# Patient Record
Sex: Female | Born: 1945 | Hispanic: Yes | Marital: Married | Smoking: Never smoker
Health system: Southern US, Community
[De-identification: ages and names within clinical notes are randomized; demographics above are authoritative.]

## PROBLEM LIST (undated history)

## (undated) DIAGNOSIS — M542 Cervicalgia: Secondary | ICD-10-CM

## (undated) DIAGNOSIS — D649 Anemia, unspecified: Secondary | ICD-10-CM

## (undated) DIAGNOSIS — I10 Essential (primary) hypertension: Secondary | ICD-10-CM

## (undated) DIAGNOSIS — G8929 Other chronic pain: Secondary | ICD-10-CM

---

## 1999-02-04 DIAGNOSIS — D649 Anemia, unspecified: Secondary | ICD-10-CM

## 1999-02-04 HISTORY — DX: Anemia, unspecified: D64.9

## 2015-03-06 ENCOUNTER — Observation Stay (HOSPITAL_COMMUNITY)
Admission: EM | Admit: 2015-03-06 | Discharge: 2015-03-07 | Disposition: A | Discharge status: Home / Self Care | Payer: Self-pay | Attending: Internal Medicine | Admitting: Internal Medicine

## 2015-03-06 ENCOUNTER — Emergency Department (HOSPITAL_COMMUNITY): Payer: Self-pay

## 2015-03-06 ENCOUNTER — Encounter (HOSPITAL_COMMUNITY): Payer: Self-pay | Admitting: Emergency Medicine

## 2015-03-06 ENCOUNTER — Observation Stay (HOSPITAL_COMMUNITY): Payer: MEDICAID

## 2015-03-06 ENCOUNTER — Observation Stay (HOSPITAL_COMMUNITY): Payer: Self-pay

## 2015-03-06 DIAGNOSIS — R079 Chest pain, unspecified: Principal | ICD-10-CM | POA: Diagnosis present

## 2015-03-06 DIAGNOSIS — E875 Hyperkalemia: Secondary | ICD-10-CM | POA: Insufficient documentation

## 2015-03-06 DIAGNOSIS — R0789 Other chest pain: Secondary | ICD-10-CM

## 2015-03-06 DIAGNOSIS — E1165 Type 2 diabetes mellitus with hyperglycemia: Secondary | ICD-10-CM | POA: Insufficient documentation

## 2015-03-06 DIAGNOSIS — I10 Essential (primary) hypertension: Secondary | ICD-10-CM | POA: Diagnosis present

## 2015-03-06 HISTORY — DX: Anemia, unspecified: D64.9

## 2015-03-06 HISTORY — DX: Cervicalgia: M54.2

## 2015-03-06 HISTORY — DX: Other chronic pain: G89.29

## 2015-03-06 HISTORY — DX: Essential (primary) hypertension: I10

## 2015-03-06 LAB — CBC
HCT: 38.7 % (ref 36.0–46.0)
HCT: 38.6 % (ref 36.0–46.0)
HEMOGLOBIN: 12.9 g/dL (ref 12.0–15.0)
Hemoglobin: 13 g/dL (ref 12.0–15.0)
MCH: 29.5 pg (ref 26.0–34.0)
MCH: 29.5 pg (ref 26.0–34.0)
MCHC: 33.3 g/dL (ref 30.0–36.0)
MCHC: 33.7 g/dL (ref 30.0–36.0)
MCV: 88.4 fL (ref 78.0–100.0)
MCV: 87.7 fL (ref 78.0–100.0)
PLATELETS: 216 10*3/uL (ref 150–400)
Platelets: 215 10*3/uL (ref 150–400)
RBC: 4.38 MIL/uL (ref 3.87–5.11)
RBC: 4.4 MIL/uL (ref 3.87–5.11)
RDW: 13.8 % (ref 11.5–15.5)
RDW: 13.7 % (ref 11.5–15.5)
WBC: 5.6 10*3/uL (ref 4.0–10.5)
WBC: 6.3 10*3/uL (ref 4.0–10.5)

## 2015-03-06 LAB — BASIC METABOLIC PANEL
Anion gap: 11 (ref 5–15)
BUN: 14 mg/dL (ref 6–20)
CHLORIDE: 105 mmol/L (ref 101–111)
CO2: 24 mmol/L (ref 22–32)
Calcium: 9.6 mg/dL (ref 8.9–10.3)
Creatinine, Ser: 0.75 mg/dL (ref 0.44–1.00)
GFR calc non Af Amer: >60 mL/min (ref >60)
Glucose, Bld: 167 mg/dL — ABNORMAL HIGH (ref 65–99)
POTASSIUM: 5.8 mmol/L — AB (ref 3.5–5.1)
SODIUM: 140 mmol/L (ref 135–145)

## 2015-03-06 LAB — NM MYOCAR MULTI W/SPECT W/WALL MOTION / EF
CHL CUP MPHR: 152 {beats}/min
CHL CUP RESTING HR STRESS: 85 {beats}/min
CSEPEDS: 44 s
Exercise duration (min): 5 min
Peak HR: 114 {beats}/min
Percent HR: 75 %

## 2015-03-06 LAB — CREATININE, SERUM: CREATININE: 0.62 mg/dL (ref 0.44–1.00)

## 2015-03-06 LAB — LIPID PANEL
Cholesterol: 178 mg/dL (ref 0–200)
HDL: 43 mg/dL (ref >40)
LDL CALC: 107 mg/dL — AB (ref 0–99)
TRIGLYCERIDES: 140 mg/dL (ref <150)
Total CHOL/HDL Ratio: 4.1 RATIO
VLDL: 28 mg/dL (ref 0–40)

## 2015-03-06 LAB — TROPONIN I

## 2015-03-06 LAB — POTASSIUM: POTASSIUM: 3.8 mmol/L (ref 3.5–5.1)

## 2015-03-06 LAB — D-DIMER, QUANTITATIVE (NOT AT ARMC): D DIMER QUANT: 0.29 ug{FEU}/mL (ref 0.00–0.50)

## 2015-03-06 LAB — TSH: TSH: 1.635 u[IU]/mL (ref 0.350–4.500)

## 2015-03-06 LAB — I-STAT TROPONIN, ED: TROPONIN I, POC: 0 ng/mL (ref 0.00–0.08)

## 2015-03-06 MED ORDER — REGADENOSON 0.4 MG/5ML IV SOLN
INTRAVENOUS | Status: AC
  Administered 2015-03-06: 0.4 mg via INTRAVENOUS
  Filled 2015-03-06: qty 5

## 2015-03-06 MED ORDER — PNEUMOCOCCAL VAC POLYVALENT 25 MCG/0.5ML IJ INJ: 0.5000 mL | INJECTION | INTRAMUSCULAR | Status: DC

## 2015-03-06 MED ORDER — ENOXAPARIN SODIUM 40 MG/0.4ML ~~LOC~~ SOLN
40.0000 mg | SUBCUTANEOUS | Status: DC
  Administered 2015-03-06: 40 mg via SUBCUTANEOUS
  Filled 2015-03-06: qty 0.4

## 2015-03-06 MED ORDER — ACETAMINOPHEN 325 MG PO TABS: 650.0000 mg | ORAL_TABLET | ORAL | Status: DC | PRN

## 2015-03-06 MED ORDER — TECHNETIUM TC 99M SESTAMIBI - CARDIOLITE
30.0000 | Freq: Once | INTRAVENOUS | Status: AC | PRN
  Administered 2015-03-06: 30 via INTRAVENOUS

## 2015-03-06 MED ORDER — NITROGLYCERIN 0.4 MG SL SUBL: 0.4000 mg | SUBLINGUAL_TABLET | SUBLINGUAL | Status: DC | PRN

## 2015-03-06 MED ORDER — INFLUENZA VAC SPLIT QUAD 0.5 ML IM SUSY: 0.5000 mL | PREFILLED_SYRINGE | INTRAMUSCULAR | Status: DC

## 2015-03-06 MED ORDER — REGADENOSON 0.4 MG/5ML IV SOLN
0.4000 mg | Freq: Once | INTRAVENOUS | Status: AC
  Administered 2015-03-06: 0.4 mg via INTRAVENOUS
  Filled 2015-03-06: qty 5

## 2015-03-06 MED ORDER — DIPHENHYDRAMINE HCL 25 MG PO CAPS
25.0000 mg | ORAL_CAPSULE | Freq: Four times a day (QID) | ORAL | Status: DC | PRN
  Administered 2015-03-06: 25 mg via ORAL
  Filled 2015-03-06: qty 1

## 2015-03-06 MED ORDER — ASPIRIN EC 325 MG PO TBEC
325.0000 mg | DELAYED_RELEASE_TABLET | Freq: Every day | ORAL | Status: DC
  Administered 2015-03-06 – 2015-03-07 (×2): 325 mg via ORAL
  Filled 2015-03-06 (×2): qty 1

## 2015-03-06 MED ORDER — TECHNETIUM TC 99M SESTAMIBI GENERIC - CARDIOLITE
10.0000 | Freq: Once | INTRAVENOUS | Status: AC | PRN
  Administered 2015-03-06: 10 via INTRAVENOUS

## 2015-03-06 MED ORDER — NITROGLYCERIN 2 % TD OINT
1.0000 [in_us] | TOPICAL_OINTMENT | Freq: Once | TRANSDERMAL | Status: AC
  Administered 2015-03-06: 1 [in_us] via TOPICAL
  Filled 2015-03-06: qty 1

## 2015-03-06 MED ORDER — ASPIRIN 81 MG PO CHEW
324.0000 mg | CHEWABLE_TABLET | Freq: Once | ORAL | Status: AC
  Administered 2015-03-06: 324 mg via ORAL
  Filled 2015-03-06: qty 4

## 2015-03-06 MED ORDER — ONDANSETRON HCL 4 MG/2ML IJ SOLN: 4.0000 mg | Freq: Four times a day (QID) | INTRAMUSCULAR | Status: DC | PRN

## 2015-03-06 NOTE — ED Provider Notes (Signed)
CSN: [id number]     Arrival date & time 03/06/15  0231 History  By signing my name below, I, Budd Palmer, attest that this documentation has been prepared under the direction and in the presence of Shon Baton, MD. Electronically Signed: Budd Palmer, ED Scribe. 03/06/2015. 3:02 AM.    Chief Complaint  Patient presents with  . Chest Pain   The history is provided by the patient and a relative. No language interpreter was used.   HPI Comments: Nicole Booker is a 70 y.o. female with a PMHx of HTN who presents to the Emergency Department complaining of constant, aching, central chest pain onset 30 minutes ago. Per daughter, the pain is ongoing and pt currently rates its severity at a 5/10. She notes pt was asleep when the pain began, causing her to wake up. Pt reports associated SOB. She endorses a PMHx of HTN for which she is not on any medication. Relative denies pt having a PMHx of smoking or heart disease. Pt denies n/v and leg swelling.   Past Medical History  Diagnosis Date  . Hypertension    History reviewed. No pertinent past surgical history. No family history on file. Social History  Substance Use Topics  . Smoking status: Never Smoker   . Smokeless tobacco: None  . Alcohol Use: No   OB History    No data available     Review of Systems  Respiratory: Positive for shortness of breath.   Cardiovascular: Positive for chest pain. Negative for leg swelling.  Gastrointestinal: Negative for nausea and vomiting.  All other systems reviewed and are negative.   Allergies  Review of patient's allergies indicates no known allergies.  Home Medications   Prior to Admission medications   Not on File   BP 101/67 mmHg  Pulse 77  Resp 17  SpO2 99% Physical Exam  Constitutional: She is oriented to person, place, and time. She appears well-developed and well-nourished. No distress.  Elderly, anxious appearing  HENT:  Head: Normocephalic and atraumatic.   Cardiovascular: Normal rate, regular rhythm and normal heart sounds.   No murmur heard. Pulmonary/Chest: Effort normal and breath sounds normal. No respiratory distress. She has no wheezes.  Abdominal: Soft. Bowel sounds are normal. There is no tenderness. There is no rebound.  Musculoskeletal: She exhibits no edema.  Neurological: She is alert and oriented to person, place, and time.  Skin: Skin is warm and dry.  Psychiatric: She has a normal mood and affect.  Nursing note and vitals reviewed.   ED Course  Procedures  DIAGNOSTIC STUDIES: Oxygen Saturation is 100% on RA, normal by my interpretation.    COORDINATION OF CARE: 2:53 AM - Discussed plans to order aspirin and NTG paste. Will order diagnostic studies and imaging. Pt advised of plan for treatment and pt agrees.  Labs Review Labs Reviewed  BASIC METABOLIC PANEL - Abnormal; Notable for the following:    Potassium 5.8 (*)    Glucose, Bld 167 (*)    All other components within normal limits  CBC  D-DIMER, QUANTITATIVE (NOT AT [facility])  POTASSIUM  I-STAT TROPOININ, ED    Imaging Review Dg Chest 2 View  03/06/2015  CLINICAL DATA:  Chest pain tonight EXAM: CHEST  2 VIEW COMPARISON:  None. FINDINGS: EKG leads create nodular opacities over the bilateral chest. Normal heart size and mediastinal contours. No acute infiltrate or edema. Symmetric biapical pleural thickening/ scarring. No effusion or pneumothorax. No acute osseous findings. IMPRESSION: No active cardiopulmonary disease.  Electronically Signed   By: Marnee Spring M.D.   On: 03/06/2015 03:11   I have personally reviewed and evaluated these images and lab results as part of my medical decision-making.   EKG Interpretation   Date/Time:  Tuesday March 06 2015 02:44:53 EST Ventricular Rate:  97 PR Interval:  139 QRS Duration: 129 QT Interval:  399 QTC Calculation: 507 R Axis:   111 Text Interpretation:  Sinus rhythm RBBB and LPFB Baseline wander in  lead(s) V6  NO prior for comparison, repeat requested in 10 min Confirmed  by HORTON  MD, COURTNEY ([id number]) on 03/06/2015 2:58:21 AM      EKG Interpretation  Date/Time:  Tuesday March 06 2015 02:57:35 EST Ventricular Rate:  91 PR Interval:  140 QRS Duration: 131 QT Interval:  404 QTC Calculation: 497 R Axis:   106 Text Interpretation:  Sinus rhythm RBBB and LPFB No dynamic changes Confirmed by HORTON  MD, COURTNEY ([id number]) on 03/06/2015 3:53:40 AM        MDM   Final diagnoses:  Other chest pain    Patient presents with chest pain. She has risk factors for heart disease including age, hypertension. EKG is abnormal. No prior for comparison. No evidence of ST elevation. Patient was given nitroglycerin ointment and pain completely improved. Initial troponin is negative. Chest x-ray is negative. She is mildly tachycardic so screening d-dimer was obtained and this was negative. Will admit patient for a chest pain rule out. Heart score is 4.  Discussed with Dr. Toniann Fail  I personally performed the services described in this documentation, which was scribed in my presence. The recorded information has been reviewed and is accurate.   Shon Baton, MD 03/06/15 [phone]

## 2015-03-06 NOTE — ED Notes (Signed)
Lab called to inform this RN that CBC hemolyzed and needs to be redrawn.

## 2015-03-06 NOTE — Progress Notes (Signed)
     The patient was seen in nuclear medicine for a lexiscan myoview. She tolerated the procedure well. No acute ST or TW changes on ECG. Await nuclear images.    Kathryn Stern PA-C  [facility]     

## 2015-03-06 NOTE — Consult Note (Signed)
CARDIOLOGY CONSULT NOTE   Patient ID: Nicole Booker MRN: [id number] DOB/AGE: 1945-11-13 70 y.o.  Admit date: 03/06/2015  Primary Physician   No primary care provider on file. Primary Cardiologist  New  Reason for Consultation  Chest pain Requesting Physician Dr. Toniann Fail  HPI: Nicole Booker is a 70 y.o. female with a history of HTN who presents to [facility] ED with substernal chest pain. Patient is from [place] and does not speak [place]. History was obtained using the daughter, who is fluent in [place], as an interpreter. Patient notes that the pain woke her up from sleep this morning around 1:30 am. Describes the pain as a tight pressure that radiated to her neck associated with SOB, headache, and palpitations. Pain was worsened with deep inspirations. Denies nausea, diaphoresis, edema, or orthopnea. Patient states that the pain spontaneously resolved after 20 minutes. In the ED, patient was given nitro ointment, pain completely improved. Diagnostic testing in ED revealed initial troponin negative, EKG showed sinus rhythm with RBBB (no prior for comparison), CXR negative, D-dimer negative. Patient was admitted and cardiology was consulted for chest pain.   Per daughter, the patient is healthy and pretty active. States that she walks daily with no exertional symptoms of chest pain or SOB. Does note that patient had a similar event of tight chest pressure 1-2 weeks ago. During this event, the patient was sitting down talking on the phone and was under a lot of stress. She experienced a tight pressure, which spontaneously resolved within 10-15 minutes. Daughter seems to think there is a correlation between the chest pain episodes and stress. Notes that the patient was stressed last night prior to going to bed. Patient reports no cardiac history. Does not see a PCP regularly but does say that a doctor in [place] told her she had high blood pressure once but did not give her any medications, is  unsure as to what her BP typically runs. Patient denies history of smoking, alcohol, or drug abuse. Also, denies FH of heart disease.   Currently, patient states she feels fine. Denies chest pain, SOB, diaphoresis, palpitations, nausea, or edema.    Past Medical History  Diagnosis Date  . Hypertension      History reviewed. No pertinent past surgical history.  No Known Allergies  I have reviewed the patient's current medications . aspirin EC  325 mg Oral Daily  . enoxaparin (LOVENOX) injection  40 mg Subcutaneous Q24H     acetaminophen, nitroGLYCERIN, ondansetron (ZOFRAN) IV  Prior to Admission medications   Not on File     Social History   Social History  . Marital Status: Single    Spouse Name: N/A  . Number of Children: N/A  . Years of Education: N/A   Occupational History  . Not on file.   Social History Main Topics  . Smoking status: Never Smoker   . Smokeless tobacco: Not on file  . Alcohol Use: No  . Drug Use: No  . Sexual Activity: Not on file   Other Topics Concern  . Not on file   Social History Narrative  . No narrative on file    No family status information on file.   Family History  Problem Relation Age of Onset  . Diabetes Mellitus II Neg Hx   . Hypertension Neg Hx      ROS:  Full 14 point review of systems complete and found to be negative unless listed above.  Physical Exam: Blood pressure 135/80, pulse  89, temperature 98.2 F (36.8 C), temperature source Oral, resp. rate 18, weight 104 lb (47.174 kg), SpO2 100 %.  General: Well developed, well nourished, pleasant female in no acute distress Head: Eyes PERRLA, No xanthomas. Normocephalic and atraumatic, oropharynx without edema or exudate.  Lungs: Resp regular and unlabored, CTAB. Chest discomfort noted during inspiration.  Heart: RRR no s3, s4, or murmurs.   Neck: No carotid bruits. No lymphadenopathy.  No JVD. Abdomen: Bowel sounds present, abdomen soft and non-tender without  masses or hernias noted. Msk:  No spine or cva tenderness. No weakness, no joint deformities or effusions. Extremities: No clubbing, cyanosis or edema. DP/PT/Radials 2+ and equal bilaterally. Neuro: Alert and oriented X 3. No focal deficits noted. Psych:  Good affect, responds appropriately Skin: No rashes or lesions noted.  Labs:   Lab Results  Component Value Date   WBC 5.6 03/06/2015   HGB 12.9 03/06/2015   HCT 38.7 03/06/2015   MCV 88.4 03/06/2015   PLT 215 03/06/2015   No results for input(s): INR in the last 72 hours.  Recent Labs Lab 03/06/15 0250 03/06/15 0437  NA 140  --   K 5.8* 3.8  CL 105  --   CO2 24  --   BUN 14  --   CREATININE 0.75  --   CALCIUM 9.6  --   GLUCOSE 167*  --     Recent Labs  03/06/15 0303  TROPIPOC 0.00   No results found for: PROBNP No results found for: CHOL, HDL, LDLCALC, TRIG Lab Results  Component Value Date   DDIMER 0.29 03/06/2015    Echo: No prior echo results   ECG:  EKG with sinus rhythm and RBBB (no prior for comparison)   Radiology:  Dg Chest 2 View  03/06/2015  CLINICAL DATA:  Chest pain tonight EXAM: CHEST  2 VIEW COMPARISON:  None. FINDINGS: EKG leads create nodular opacities over the bilateral chest. Normal heart size and mediastinal contours. No acute infiltrate or edema. Symmetric biapical pleural thickening/ scarring. No effusion or pneumothorax. No acute osseous findings. IMPRESSION: No active cardiopulmonary disease. Electronically Signed   By: Marnee Spring M.D.   On: 03/06/2015 03:11    ASSESSMENT AND PLAN:     1) Chest pain: Patient experienced chest pain this morning around 1:30 am that awoke her from sleep, was described as a tight substernal pressure radiating to her neck, associated with SOB, headache, and palpitations. Pain worsened with deep inspiration. Resolved spontaneously in 20 minutes. Has experienced one other similar episode 1-2 weeks ago while sitting down talking on the phone, under a lot of  stress. Once in the ED, nitro ointment was given and chest pain was completely improved. Initial troponin was negative, EKG with sinus rhythm and RBBB (no prior for comparison), CXR negative, D-dimer negative. Patient claims that chest pain has resolved but during lung exam, patient admitted to chest discomfort during inspiration. Would recommend serial troponins and EKGs. Chest pain possesses both typical and atypical features. Considering patient has had no prior cardiac work up, untreated HTN, no definitive testing for HLD or diabetes, would recommend stress test. Check A1C, lipid panel, and TSH. Will plan for lexiscan myoview today. She is NPO  2) Essential hypertension: Initial BP reading in ED was 145/84. Patient states she was told once by a doctor in [place] that she has high blood pressure but was never given any medications. After nitro ointment in ED, BP in 100-120/60-80 range. Last reading at 6am was  135/80. Continue to monitor.   3) Hyperglycemia: glucose reading in ED was 167, unsure if patient was fasting. Would recommend checking A1C as patient has never been evaluated for diabetes.   4) Hyperkalemia: resolved; initial K was 5.8, now 3.8.    Signed: Benjiman Core, Student-PA 03/06/2015, 9:14 AM   Co-Sign MD

## 2015-03-06 NOTE — H&P (Signed)
[facility] History and Physical  Nicole Booker ZOX:[id number] DOB: 1945-05-02 DOA: 03/06/2015  Referring physician: Dr.Horton. PCP: No primary care provider on file. patient was sitting from [place]. Specialists: None.  Chief Complaint: Chest pain.  Spanish interpreter used.  HPI: Nicole Booker is a 70 y.o. female with history of hypertension presents to the ER with chest pain. Patient woke up from sleep last night with chest pain. Patient's chest pain was retrosternal. Had mild associated shortness of breath. Denies any nausea vomiting diaphoresis or palpitations. After reaching the ER patient was given sublingual nitroglycerin for which patient's chest pain improved. Patient is chest pain-free at this time. EKG and cardiac markers are negative and patient has been admitted for further management of chest pain.   Review of Systems: As presented in the history of presenting illness, rest negative.  Past Medical History  Diagnosis Date  . Hypertension    History reviewed. No pertinent past surgical history. Social History:  reports that she has never smoked. She does not have any smokeless tobacco history on file. She reports that she does not drink alcohol or use illicit drugs. Where does patient live home. Can patient participate in ADLs? Yes.  No Known Allergies  Family History:  Family History  Problem Relation Age of Onset  . Diabetes Mellitus II Neg Hx   . Hypertension Neg Hx       Prior to Admission medications   Not on File    Physical Exam: Filed Vitals:   03/06/15 0430 03/06/15 0445 03/06/15 0500 03/06/15 0601  BP: 101/65 103/64 101/67 135/80  Pulse: 83 79 77 89  Temp:    98.2 F (36.8 C)  TempSrc:    Oral  Resp: Weight:    47.174 kg (104 lb)  SpO2: 99% 99% 99% 100%     General:  Moderately built and nourished.  Eyes: Anicteric. No pallor.  ENT: No discharge from the ears eyes nose or mouth.  Neck: No mass felt. No JVD  appreciated.  Cardiovascular: S1-S2 heard.  Respiratory: No rhonchi or crepitations.  Abdomen: Soft nontender bowel sounds present.  Skin: No rash.  Musculoskeletal: No edema.  Psychiatric: Appears normal.  Neurologic: Alert awake oriented to time place and person. Moves all extremities.  Labs on Admission:  Basic Metabolic Panel:  Recent Labs Lab 03/06/15 0250 03/06/15 0437  NA 140  --   K 5.8* 3.8  CL 105  --   CO2 24  --   GLUCOSE 167*  --   BUN 14  --   CREATININE 0.75  --   CALCIUM 9.6  --    Liver Function Tests: No results for input(s): AST, ALT, ALKPHOS, BILITOT, PROT, ALBUMIN in the last 168 hours. No results for input(s): LIPASE, AMYLASE in the last 168 hours. No results for input(s): AMMONIA in the last 168 hours. CBC:  Recent Labs Lab 03/06/15 0350  WBC 5.6  HGB 12.9  HCT 38.7  MCV 88.4  PLT 215   Cardiac Enzymes: No results for input(s): CKTOTAL, CKMB, CKMBINDEX, TROPONINI in the last 168 hours.  BNP (last 3 results) No results for input(s): BNP in the last 8760 hours.  ProBNP (last 3 results) No results for input(s): PROBNP in the last 8760 hours.  CBG: No results for input(s): GLUCAP in the last 168 hours.  Radiological Exams on Admission: Dg Chest 2 View  03/06/2015  CLINICAL DATA:  Chest pain tonight EXAM: CHEST  2 VIEW COMPARISON:  None. FINDINGS: EKG leads create nodular opacities over the bilateral chest. Normal heart size and mediastinal contours. No acute infiltrate or edema. Symmetric biapical pleural thickening/ scarring. No effusion or pneumothorax. No acute osseous findings. IMPRESSION: No active cardiopulmonary disease. Electronically Signed   By: Marnee Spring M.D.   On: 03/06/2015 03:11    EKG: Independently reviewed. Normal sinus rhythm.  Assessment/Plan Principal Problem:   Chest pain Active Problems:   Essential hypertension   1. Chest pain - concerning for angina. We will cycle cardiac markers. Keep patient  nothing by mouth in anticipation of procedure. Check 2-D echo.. Nitroglycerin. Aspirin. Cardiology has been notified for consult. 2. Hypertension - presently controlled. Continue home medications after verifying patient's medication name.   DVT Prophylaxis Lovenox.  Code Status: Full code.  Family Communication: Discussed with patient's daughter-in-law.  Disposition Plan: Admit for observation.    Nicole Booker N. [facility] Pager [phone].  If 7PM-7AM, please contact night-coverage [email or web address] Password [email or web address] 03/06/2015, 6:04 AM       \

## 2015-03-06 NOTE — ED Notes (Signed)
Pt. reports central chest pain with SOB onset this evening , denies nausea or diaphoresis , no cough or fever .

## 2015-03-06 NOTE — Progress Notes (Signed)
   [facility]                                                                              Patient Demographics  Nicole Booker, is a 70 y.o. female, DOB - 23-Dec-1945, WUJ:[id number]  Admit date - 03/06/2015   Admitting Physician Eduard Clos, MD  Outpatient Primary MD for the patient is No primary care provider on file.  LOS -    Chief Complaint  Patient presents with  . Chest Pain      HPI on 03/06/2015 by Dr. Midge Minium Nicole Booker is a 70 y.o. female with history of hypertension presents to the ER with chest pain. Patient woke up from sleep last night with chest pain. Patient's chest pain was retrosternal. Had mild associated shortness of breath. Denies any nausea vomiting diaphoresis or palpitations. After reaching the ER patient was given sublingual nitroglycerin for which patient's chest pain improved. Patient is chest pain-free at this time. EKG and cardiac markers are negative and patient has been admitted for further management of chest pain.   Assessment & Plan   Patient admitted earlier today by Dr. Midge Minium.  See H&P for full details. Agree with current assessment and plan.  Chest pain -Patient did experience chest pain that awoke her from sleep this morning with associated neck pain and palpitations. The pain did resolve on its own. She did have a similar episode in [place] approximately one year ago as well as 2 weeks ago. -Initial troponin negative, continue to cycle -Chest x-ray unremarkable -Cardiology consulted and appreciated -Pending lipid panel, hemoglobin A1c, TSH -Stress test pending -echocardiogram pending  Essential Hypertension -No home medications, diagnosed in [place] however was not placed on medication  -Continue to monitor   Hyperkalemia  -Resolved, monitor BMP   Hyperglycemia -No known history of diabetes mellitus, pending hemoglobin A1c    Code Status: Full  Family Communication: Family at  bedside  Disposition Plan: Admitted  Time Spent in minutes   30 minutes  Procedures  lexiscan  Consults   Cardiology  DVT Prophylaxis  Lovenox   Joshuajames Moehring D.O. on 03/06/2015 at 12:21 PM  Between 7am to 7pm - Pager - [phone]  After 7pm go to [email or web address] - password [email or web address]  And look for the night coverage person covering for me after hours  [facility] Office  [phone]

## 2015-03-07 ENCOUNTER — Observation Stay (HOSPITAL_BASED_OUTPATIENT_CLINIC_OR_DEPARTMENT_OTHER): Payer: Self-pay

## 2015-03-07 DIAGNOSIS — R079 Chest pain, unspecified: Secondary | ICD-10-CM

## 2015-03-07 DIAGNOSIS — R072 Precordial pain: Secondary | ICD-10-CM

## 2015-03-07 LAB — HEMOGLOBIN A1C
Hgb A1c MFr Bld: 7.2 % — ABNORMAL HIGH (ref 4.8–5.6)
Mean Plasma Glucose: 160 mg/dL

## 2015-03-07 MED ORDER — RANITIDINE HCL 150 MG PO TABS: 150.0000 mg | ORAL_TABLET | Freq: Two times a day (BID) | ORAL | Status: DC

## 2015-03-07 MED ORDER — METFORMIN HCL 500 MG PO TABS: 500.0000 mg | ORAL_TABLET | Freq: Two times a day (BID) | ORAL | Status: DC

## 2015-03-07 NOTE — Hospital Discharge Follow-Up (Addendum)
This Case Manager received phone call from Donn Pierini, RN CM. Patient needing hospital follow-up appointment. Appointment scheduled for 03/08/15 at 1000 with Dr. Venetia Night. Appointment on AVS. Donn Pierini, RN CM appreciative. No additional needs identified.

## 2015-03-07 NOTE — Care Management Note (Addendum)
Case Management Note Donn Pierini RN, BSN Unit 2W-Case Manager [phone]  Patient Details  Name: Nicole Booker MRN: [id number] Date of Birth: July 25, 1945  Subjective/Objective:   Pt admitted with C/P, HTN                 Action/Plan: PTA pt lived at home- is here visiting daughter from [place], has seen a doctor in [place] but was not placed on any medications per chart- spoke with pt's daughter at bedside- pt will be here until Mar. 12 then will return to [place]- per daughter plan is to f/u with a doctor once she returns home. Referral received for pt to have f/u here prior to her return- have called Shanda Bumps with [facility] to see if it is possible for pt to come to [facility] clinic for a one time f/u from hospital prior to returning home. Have looked at pt's discharge medications- they are on $4 list at [facility]- per daughter they will be able to get medications and will not have a problem with cost. Appointment was able to be made at [facility] for tomorrow 03/08/15- at 10:00 with Dr. Venetia Night- this will allow pt to establish with the clinic and if needs futher f/u prior to returning to [place] she will be able to do so- info on clinic location and appointment given to daughter.   Expected Discharge Date:     03/07/15             Expected Discharge Plan:  Home/Self Care  In-House Referral:     Discharge planning Services  CM Consult, Follow-up appt scheduled  Post Acute Care Choice:    Choice offered to:     DME Arranged:    DME Agency:     HH Arranged:    HH Agency:     Status of Service:  Completed, signed off  Medicare Important Message Given:    Date Medicare IM Given:    Medicare IM give by:    Date Additional Medicare IM Given:    Additional Medicare Important Message give by:     If discussed at Long Length of Stay Meetings, dates discussed:    Additional Comments:  Darrold Span, RN 03/07/2015, 12:09 PM

## 2015-03-07 NOTE — Discharge Summary (Signed)
Physician Discharge Summary  Nicole Booker QQI:[id number] DOB: Oct 06, 1945 DOA: 03/06/2015  PCP: No PCP Per Patient  Admit date: 03/06/2015 Discharge date: 03/07/2015  Time spent: 35 minutes  Recommendations for Outpatient Follow-up:  Follow up in 3 weeks for diabetes care.  Check b-met to follow up on electrolytes.   Discharge Diagnoses:    Chest pain   Diabetes.    Discharge Condition: stable.   Diet recommendation: Carb modified.   Filed Weights   03/06/15 0601  Weight: 47.174 kg (104 lb)    History of present illness:  Nicole Booker is a 70 y.o. female with history of hypertension presents to the ER with chest pain. Patient woke up from sleep last night with chest pain. Patient's chest pain was retrosternal. Had mild associated shortness of breath. Denies any nausea vomiting diaphoresis or palpitations. After reaching the ER patient was given sublingual nitroglycerin for which patient's chest pain improved. Patient is chest pain-free at this time. EKG and cardiac markers are negative and patient has been admitted for further management of chest pain.    Hospital Course:  Chest pain -Patient did experience chest pain that awoke her from sleep this morning with associated neck pain and palpitations. The pain did resolve on its own. She did have a similar episode in [place] approximately one year ago as well as 2 weeks ago. -Initial troponin negative, continue to cycle -Chest x-ray unremarkable -Cardiology consulted and appreciated -Pending lipid panel, hemoglobin A1c at 7., TSH -Stress test low risk. Ok to discharge per cardiology  Will prescribe ranitidine.   Essential Hypertension -No home medications, diagnosed in [place] however was not placed on medication  -Continue to monitor  BP normal. No need for medications.   Hyperkalemia  -Resolved, monitor BMP   New diagnosis of Diabetes. Hyperglycemia - hemoglobin A1c at 7.2. Patient will be discharge on  metformin, I instructed to start metformin 48 hours after cath.    Procedures:  Stress test   Consultations:  Cardiology   Discharge Exam: Filed Vitals:   03/06/15 2208 03/07/15 0546  BP: 94/57 90/58  Pulse: 77 83  Temp: 98.2 F (36.8 C) 97.6 F (36.4 C)  Resp: 17 18    General: NAD Cardiovascular: S 1, S 2 RRR Respiratory: CTA  Discharge Instructions   Discharge Instructions    Diet Carb Modified    Complete by:  As directed      Increase activity slowly    Complete by:  As directed           Current Discharge Medication List    START taking these medications   Details  metFORMIN (GLUCOPHAGE) 500 MG tablet Take 1 tablet (500 mg total) by mouth 2 (two) times daily with a meal. Qty: 60 tablet, Refills: 0    ranitidine (ZANTAC) 150 MG tablet Take 1 tablet (150 mg total) by mouth 2 (two) times daily. Qty: 30 tablet, Refills: 0       No Known Allergies    The results of significant diagnostics from this hospitalization (including imaging, microbiology, ancillary and laboratory) are listed below for reference.    Significant Diagnostic Studies: Dg Chest 2 View  03/06/2015  CLINICAL DATA:  Chest pain tonight EXAM: CHEST  2 VIEW COMPARISON:  None. FINDINGS: EKG leads create nodular opacities over the bilateral chest. Normal heart size and mediastinal contours. No acute infiltrate or edema. Symmetric biapical pleural thickening/ scarring. No effusion or pneumothorax. No acute osseous findings. IMPRESSION: No active cardiopulmonary disease. Electronically  Signed   By: Monte Fantasia M.D.   On: 03/06/2015 03:11   Nm Myocar Multi W/spect W/wall Motion / Ef  03/06/2015  CLINICAL DATA:  Chest pain. Palpitations. Hypertension. Right bundle branch block. EXAM: MYOCARDIAL IMAGING WITH SPECT (REST AND PHARMACOLOGIC-STRESS) GATED LEFT VENTRICULAR WALL MOTION STUDY LEFT VENTRICULAR EJECTION FRACTION TECHNIQUE: Standard myocardial SPECT imaging was performed after resting  intravenous injection of 10 mCi [id number]. Subsequently, intravenous infusion of Lexiscan was performed under the supervision of the Cardiology staff. At peak effect of the drug, 30 mCi [id number] was injected intravenously and standard myocardial SPECT imaging was performed. Quantitative gated imaging was also performed to evaluate left ventricular wall motion, and estimate left ventricular ejection fraction. COMPARISON:  03/06/2015 radiograph FINDINGS: Perfusion: No perfusion defect identified. The long axis of the heart is fairly small, but this is merit on the conventional radiographs with the small cardiac shadow, accordingly I am not suspicious for a circumferential basilar infarct. Wall Motion: Normal left ventricular wall motion. No left ventricular dilation. Left Ventricular Ejection Fraction: 86 % End diastolic volume 41 ml End systolic volume 6 ml IMPRESSION: 1. No reversible ischemia or infarction. 2. Normal left ventricular wall motion. 3. Left ventricular ejection fraction 86% (likely somewhat overestimated due to the low end systolic counting statistics). 4. Low-risk stress test findings*. *2012 Appropriate Use Criteria for Coronary Revascularization Focused Update: J Am Coll Cardiol. 209169;[email or web address] Electronically Signed   By: WaVan Clines.D.   On: 03/06/2015 14:44    Microbiology: No results found for this or any previous visit (from the past 240 hour(s)).   Labs: Basic Metabolic Panel:  Recent Labs Lab 03/06/15 0250 03/06/15 0437 03/06/15 0922  NA 140  --   --   K 5.8* 3.8  --   CL 105  --   --   CO2 24  --   --   GLUCOSE 167*  --   --   BUN 14  --   --   CREATININE 0.75  --  0.62  CALCIUM 9.6  --   --    Liver Function Tests: No results for input(s): AST, ALT, ALKPHOS, BILITOT, PROT, ALBUMIN in the last 168 hours. No results for input(s): LIPASE, AMYLASE in the last 168 hours. No results  for input(s): AMMONIA in the last 168 hours. CBC:  Recent Labs Lab 03/06/15 0350 03/06/15 0922  WBC 5.6 6.3  HGB 12.9 13.0  HCT 38.7 38.6  MCV 88.4 87.7  PLT 215 216   Cardiac Enzymes:  Recent Labs Lab 03/06/15 0922 03/06/15 1512 03/06/15 2018  TROPONINI <0.03 <0.03 <0.03   BNP: BNP (last 3 results) No results for input(s): BNP in the last 8760 hours.  ProBNP (last 3 results) No results for input(s): PROBNP in the last 8760 hours.  CBG: No results for input(s): GLUCAP in the last 168 hours.     Signed:  ReElmarie ShileyD.  [facility] 03/07/2015, 11:48 AM

## 2015-03-07 NOTE — Progress Notes (Signed)
Reviewed d/c instructions with patient Nicole Booker speaks Spanish] and her daughter [interprets] including AVS printout and Rx, follow-up appointment tomorrow and instructions - all questions answered. IV access removed. Volunteer will assist patient via w/c with family to [facility] exit for d/c. Patient is from [place] and is here in [place] visiting.

## 2015-03-07 NOTE — Progress Notes (Signed)
  Echocardiogram 2D Echocardiogram has been performed.  Nicole Booker 03/07/2015, 2:17 PM

## 2015-03-08 ENCOUNTER — Ambulatory Visit: Payer: Self-pay | Attending: Family Medicine | Admitting: Family Medicine

## 2015-03-08 ENCOUNTER — Ambulatory Visit: Payer: Self-pay | Attending: Family Medicine | Admitting: Pharmacist

## 2015-03-08 ENCOUNTER — Encounter: Payer: Self-pay | Admitting: Family Medicine

## 2015-03-08 VITALS — BP 111/70 | HR 95 | Temp 97.8°F | Resp 18 | Ht <= 58 in | Wt 103.0 lb

## 2015-03-08 DIAGNOSIS — R079 Chest pain, unspecified: Secondary | ICD-10-CM | POA: Insufficient documentation

## 2015-03-08 DIAGNOSIS — K219 Gastro-esophageal reflux disease without esophagitis: Secondary | ICD-10-CM | POA: Insufficient documentation

## 2015-03-08 DIAGNOSIS — Z7984 Long term (current) use of oral hypoglycemic drugs: Secondary | ICD-10-CM | POA: Insufficient documentation

## 2015-03-08 DIAGNOSIS — E119 Type 2 diabetes mellitus without complications: Secondary | ICD-10-CM

## 2015-03-08 DIAGNOSIS — Z79899 Other long term (current) drug therapy: Secondary | ICD-10-CM | POA: Insufficient documentation

## 2015-03-08 MED ORDER — TRUE METRIX METER DEVI
1.0000 | Freq: Every day | Status: AC
Start: 2015-03-08 — End: ?

## 2015-03-08 MED ORDER — TRUEPLUS LANCETS 28G MISC: 1.0000 | Freq: Every day | Status: AC

## 2015-03-08 MED ORDER — GLUCOSE BLOOD VI STRP: ORAL_STRIP | Status: AC

## 2015-03-08 MED FILL — TRUEplus LANCETS 28G MISC: 30 days supply | Qty: 100 | Fill #0

## 2015-03-08 MED FILL — !TRUE METRIX BLOOD GLUCOSE: 1 days supply | Qty: 1 | Fill #0

## 2015-03-08 MED FILL — metFORMIN HCL 500 MG TABS: 500 | 30 days supply | Qty: 60 | Fill #0

## 2015-03-08 MED FILL — raNITIdine HCL 150 MG TABS: 150 | 15 days supply | Qty: 30 | Fill #0

## 2015-03-08 MED FILL — TRUE METRIX TEST STRIP: 30 days supply | Qty: 100 | Fill #0

## 2015-03-08 NOTE — Progress Notes (Signed)
Subjective:  Patient ID: Nicole Booker, female    DOB: [birth year]  Age: 70 y.o. MRN: [id number]  CC: Hospitalization Follow-up   HPI Nicole Booker is a 70 year old woman who is visiting from [place] and recently presented to [facility] ED with chest pain.  Serial troponins and EKG were normal, 2-D echo revealed ejection fraction of 55-60%, grade 1 diastolic dysfunction, need for stress test revealed no reversible ischemia or infarction, normal left ventricular wall motion. She had blood work which revealed elevated A1c of 7.2 and a new diagnosis of diabetes mellitus for which she was placed on metformin. She was also placed on ranitidine as chest pain was thought to be GI related and subsequently discharged.  She is accompanied by her daughter today and will be leaving for [place] next month; she is yet to pick up her metformin prescription for ranitidine and has no complaints today.  Outpatient Prescriptions Prior to Visit  Medication Sig Dispense Refill  . metFORMIN (GLUCOPHAGE) 500 MG tablet Take 1 tablet (500 mg total) by mouth 2 (two) times daily with a meal. (Patient not taking: Reported on 03/08/2015) 60 tablet 0  . ranitidine (ZANTAC) 150 MG tablet Take 1 tablet (150 mg total) by mouth 2 (two) times daily. (Patient not taking: Reported on 03/08/2015) 30 tablet 0   No facility-administered medications prior to visit.    ROS Review of Systems  Constitutional: Negative for activity change and appetite change.  HENT: Negative for sinus pressure and sore throat.   Respiratory: Negative for chest tightness, shortness of breath and wheezing.   Cardiovascular: Negative for chest pain and palpitations.  Gastrointestinal: Negative for abdominal pain, constipation and abdominal distention.  Genitourinary: Negative.   Musculoskeletal: Negative.   Psychiatric/Behavioral: Negative for behavioral problems and dysphoric mood.    Objective:  BP 111/70 mmHg  Pulse 95  Temp(Src)  97.8 F (36.6 C) (Oral)  Resp 18  Ht  (1.473 m)  Wt 103 lb (46.72 kg)  BMI 21.53 kg/m2  SpO2 98%  BP/Weight 03/08/2015 03/07/2015 03/06/2015  Systolic BP 111 90 -  Diastolic BP 70 58 -  Wt. (Lbs) 103 - 104  BMI 21.53 - -      Physical Exam  Constitutional: She is oriented to person, place, and time. She appears well-developed and well-nourished.  Cardiovascular: Normal rate, normal heart sounds and intact distal pulses.   No murmur heard. Pulmonary/Chest: Effort normal and breath sounds normal. She has no wheezes. She has no rales. She exhibits no tenderness.  Abdominal: Soft. Bowel sounds are normal. She exhibits no distension and no mass. There is no tenderness.  Musculoskeletal: Normal range of motion.  Neurological: She is alert and oriented to person, place, and time.   Lab Results  Component Value Date   HGBA1C 7.2* 03/06/2015    CMP Latest Ref Rng 03/06/2015 03/06/2015 03/06/2015  Glucose 65 - 99 mg/dL - - 409(W)  BUN 6 - 20 mg/dL - - 14  Creatinine 1.19 - 1.00 mg/dL 1.47 - 8.29  Sodium 562 - 145 mmol/L - - 140  Potassium 3.5 - 5.1 mmol/L - 3.8 5.8(H)  Chloride 101 - 111 mmol/L - - 105  CO2 22 - 32 mmol/L - - 24  Calcium 8.9 - 10.3 mg/dL - - 9.6      Assessment & Plan:   1. Type 2 diabetes mellitus without complication, without long-term current use of insulin (HCC) Newly diagnosed with A1c of 7.2 Continue metformin Microalbumin at next office  visit. Discussed Pneumovax with the patient and she states she obtain this while in [place]; advised an annual eye exam. I'll schedule with the clinical pharmacist for diabetic teaching and education regarding home blood glucose monitoring - Blood Glucose Monitoring Suppl (TRUE METRIX METER) DEVI; 1 each by Does not apply route daily before breakfast.  Dispense: 1 Device; Refill: 0 - TRUEPLUS LANCETS 28G MISC; 1 each by Does not apply route daily before breakfast.  Dispense: 100 each; Refill: 12 - glucose blood (TRUE  METRIX BLOOD GLUCOSE TEST) test strip; Use daily before meals  Dispense: 30 each; Refill: 3  2. Gastroesophageal reflux disease without esophagitis Patient advised to pick up prescription for ranitidine.   Meds ordered this encounter  Medications  . Blood Glucose Monitoring Suppl (TRUE METRIX METER) DEVI    Sig: 1 each by Does not apply route daily before breakfast.    Dispense:  1 Device    Refill:  0  . TRUEPLUS LANCETS 28G MISC    Sig: 1 each by Does not apply route daily before breakfast.    Dispense:  100 each    Refill:  12  . glucose blood (TRUE METRIX BLOOD GLUCOSE TEST) test strip    Sig: Use daily before meals    Dispense:  30 each    Refill:  3    Follow-up: Return in about 2 weeks (around 03/22/2015) for Follow-up of diabetes mellitus: Place on Stacy schedule for diabetic teaching.Jaclyn Shaggy MD

## 2015-03-08 NOTE — Patient Instructions (Signed)
Recuento bsico de carbohidratos para la diabetes mellitus (Basic Carbohydrate Counting for Diabetes Mellitus) El recuento de carbohidratos es un mtodo destinado a calcular la cantidad de carbohidratos en la dieta. El consumo de carbohidratos aumenta naturalmente el nivel de azcar (glucosa) en la sangre, por lo que es importante que sepa la cantidad que debe incluir en cada comida. El recuento de carbohidratos ayuda a mantener el nivel de glucosa en la sangre dentro de los lmites normales. La cantidad permitida de carbohidratos es diferente para cada persona. Un nutricionista puede ayudarlo a calcular la cantidad adecuada para usted. Una vez que sepa la cantidad de carbohidratos que puede consumir, podr calcular los carbohidratos de los alimentos que desea comer. Los siguientes alimentos incluyen carbohidratos:  Granos, como panes y cereales.  Frijoles secos y productos con soja.  Vegetales almidonados, como papas, guisantes y maz.  Frutas y jugos de frutas.  Leche y yogur.  Dulces y bocadillos, como pastel, galletas, caramelos, papas fritas de bolsa, refrescos y bebidas frutales con azcar. RECUENTO DE CARBOHIDRATOS Hay dos maneras de calcular los carbohidratos de los alimentos. Puede usar cualquiera de los dos mtodos o una combinacin de ambos. Leer la etiqueta de informacin nutricional de los alimentos envasados La informacin nutricional es una etiqueta incluida en casi todas las bebidas y los alimentos envasados de los [place]. Indica el tamao de la porcin de ese alimento o bebida e informacin sobre los nutrientes de cada porcin, incluso los gramos (g) de carbohidratos por porcin.  Decida la cantidad de porciones que comer o tomar de este alimento o bebida. Multiplique la cantidad de porciones por el nmero de gramos de carbohidratos indicados en la etiqueta para esa porcin. El total ser la cantidad de carbohidratos que consumir al comer ese alimento o tomar esa  bebida. Conocer las porciones estndar de los alimentos Cuando coma alimentos no envasados o que no incluyan la informacin nutricional en la etiqueta, deber medir las porciones para poder calcular la cantidad de carbohidratos. Una porcin de la mayora de los alimentos ricos en carbohidratos contiene alrededor de 15g de carbohidratos. La siguiente lista incluye los tamaos de porcin de los alimentos ricos en carbohidratos que contienen alrededor de 15g de carbohidratos por porcin:   1rebanada de pan (1oz) o 1tortilla de seis pulgadas.  panecillo de hamburguesa o bollito tipo ingls.  4a 6galletas.   de taza de cereal sin azcar y seco.   taza de cereal caliente.   de taza de arroz o pastas.  taza de pur de papas o de una papa grande al horno.  1taza de frutas frescas o una fruta pequea.  taza de frutas o jugo de frutas enlatados o congelados.  1 taza de leche.   de taza de yogur descremado sin ningn agregado o de yogur endulzado con edulcorante artificial.  taza de vegetales almidonados, como guisantes, maz o papas, o de frijoles secos cocidos. Decida la cantidad de porciones estndar que comer. Multiplique la cantidad de porciones por 15 (los gramos de carbohidratos en esa porcin). Por ejemplo, si come 2tazas de fresas, habr comido 2porciones y 30g de carbohidratos (2porciones x 15g = 30g). Para las comidas como sopas y guisos, en las que se mezcla ms de un alimento, deber contar los carbohidratos de cada alimento incluido. EJEMPLO DE RECUENTO DE CARBOHIDRATOS Ejemplo de cena  3 onzas de pechugas de pollo.   de taza de arroz integral.   taza de maz.  1 taza de leche.  1 taza de fresas   con crema batida sin azcar. Clculo de carbohidratos [place] 1: Identifique los alimentos que contienen carbohidratos:   Arroz.  Maz.  Leche.  Nicole Booker. [place] 2: Calcule el nmero de porciones que consumir de cada uno:   2 porciones de  Surveyor, minerals.  1 porcin de maz.  1 porcin de leche.  1 porcin de fresas. [place] 3: Multiplique cada una de esas porciones por 15g:   2 porciones de arroz x 15 g = 30 g.  1 porcin de maz x 15 g = 15 g.  1 porcin de leche x 15 g = 15 g.  1 porcin de fresas x 15 g = 15 g. Paso 4: Sume todas las cantidades para Artist total de gramos de carbohidratos consumidos: 30 g + 15 g + 15 g + 15 g = 75 g.   Esta informacin no tiene Theme park manager el consejo del mdico. Asegrese de hacerle al mdico cualquier pregunta que tenga.   Document Released: 04/14/2011 Document Revised: 02/10/2014 Elsevier Interactive Patient Education 2016 [employer]. Hipoglucemia (Hypoglycemia) Un bajo nivel de azcar en la sangre (hipoglucemia) implica que es menor de lo que debera ser. Algunos signos de hipoglucemia son:  Transpiracin abundante.  Aumento del apetito.  Sensacin de [employer] o debilidad.  Sentir ms sueo que lo habitual.  Nerviosismo.  Dolor de [place].  Pulsaciones rpidas. El nivel de azcar en sangre puede descender rpidamente y puede ser Radio broadcast assistant. Su mdico puede hacerle estudios para Medical sales representative. Puede ser que tenga hipoglucemia y no sea diabtico. CUIDADOS EN EL HOGAR  Controle el nivel de azcar en la sangre como lo indique su mdico. Si es menor de 70 mg / dl , o el que le indique su mdico, tome una de las siguientes medidas:  3  4 tabletas de glucosa.   taza de jugo liviano.   taza de gaseosa que no sea diettica.  1 taza de leche  5  6 caramelos duros.  Vuelva a controlarse despus de 15 minutos. Repita hasta que el nivel sea el [place].  Tome una colacin si falta ms de 1 hora hasta la prxima comida.  Tome los medicamentos tal como se los prescribi el mdico.  No saltee las comidas. Coma siempre a la misma hora.  Debe evitar las bebidas alcohlicas, excepto con las comidas.  Controle su nivel de glucosa antes de  conducir.  Controle su nivel de glucosa antes y despus de hacer actividad fsica.  Lleve siempre los medicamentos con usted,como por ejemplo las pastillas para la glucosa.  Siempre use un brazalete de alerta mdica si es diabtico. SOLICITE AYUDA DE INMEDIATO SI:   El nivel de glucosa desciende por debajo de 70 mg / dl o el que le indique su mdico, y:  Se siente confundido  No puede tragar.  Se desmaya.  No puede solucionarlo por sus propios medios. Puede ser necesario que alguien lo ayude.  Este problema le ocurre con frecuencia.  Tiene algn problema con los medicamentos.  No se siente bien despus de 3  4 das.  Observa cambios en la visin. ASEGRESE DE QUE:   Comprende estas instrucciones.  Controlar la enfermedad.  Solicitar ayuda de inmediato si no mejora o empeora.   Esta informacin no tiene Theme park manager el consejo del mdico. Asegrese de hacerle al mdico cualquier pregunta que tenga.   Document Released: 02/22/2010 Document Revised: 02/10/2014 Elsevier Interactive Patient Education [employer] Control del nivel de glucosa en la [place] -  Adultos (Blood Glucose Monitoring, Adult) El control de la glucosa en la sangre (tambin llamada azcar en la sangre) lo ayudar a tener la diabetes bajo control. Tambin ayuda a que usted y Lexicographer la diabetes y determinen si el tratamiento es Engineer, manufacturing. POR QU HAY QUE CONTROLAR LA GLUCOSA EN LA SANGRE?  Esto puede ayudar a comprender de [employer], la actividad fsica y los medicamentos inciden en los niveles de [place].  Le permite conocer el nivel de glucosa en la sangre en cualquier momento dado. Puede saber rpidamente si el nivel es bajo (hipoglucemia) o alto (hiperglucemia).  Puede ser de ayuda para que usted y el mdico sepan cmo Presenter, broadcasting,  y para entender cmo controlar una enfermedad o ajustar los medicamentos para hacer ejercicio. CUNDO DEBE  HACERSE LAS PRUEBAS? El mdico lo ayudar a decidir con qu frecuencia deber [employer] niveles de glucosa en la [place]. Esto puede depender del tipo de diabetes que tenga, su control de la diabetes o los tipos de medicamentos que tome. Asegrese de anotar todos los valores de la glucosa en la [place], de modo que esta informacin pueda ser revisada por su mdico. A continuacin puede ver ejemplos de los momentos para Education officer, environmental la prueba que el mdico puede Neurosurgeon. Diabetes tipo1  Mdaselo al menos 2 veces al da si la diabetes est bien controlada, si [place] una bomba de insulina o si se aplica muchas inyecciones diarias.  Si la diabetes no est bien controlada o si est enfermo, puede ser necesario que se controle con ms frecuencia.  Es recomendable que tambin lo mida en estas oportunidades:  Antes de cada inyeccin de insulina.  Antes y despus de hacer ejercicio.  Entre las comidas y 2horas despus de Arts administrator.  Ocasionalmente, entre las 2:00a.m. y las 3:00a.m. Diabetes tipo2  Si est utilizando insulina, realice la medicin al menos 2 veces al [employer]. Sin embargo, es Insurance claims handler medicin antes de cada inyeccin de [place].  Si toma medicamentos por boca (va oral), hgase la prueba 2veces por da.  Si sigue una dieta controlada, hgase la prueba una vez por da.  Si la diabetes no est bien controlada o si est enfermo, puede ser necesario que se controle con ms frecuencia. CMO CONTROLAR EL NIVEL DE GLUCOSA EN LA SANGRE Insumos necesarios  Medidor de glucosa en la sangre.  Tiras reactivas para el medidor. Cada medidor tiene sus propias tiras reactivas. Debe usar las tiras reactivas correspondientes a su medidor.  Una aguja para pinchar (lanceta).  Un dispositivo que sujeta la lanceta (dispositivo de puncin).  Un diario o libro de anotaciones para [employer]. Procedimiento  Lave sus manos con agua y [place]. No se recomienda usar alcohol.  Pnchese  el costado del dedo (no la punta) con Optometrist.  Apriete suavemente el dedo hasta que aparezca una pequea gota de [place].  Siga las instrucciones que vienen con el medidor para Public affairs consultant tira Firefighter, Contractor la sangre sobre la tira y usar el medidor de Horticulturist, commercial. Otras zonas de las que se puede tomar sangre para la prueba Algunos medidores le permiten tomar sangre para la prueba de otras zonas del cuerpo (que no son el dedo). Estas reas se llaman sitios alternativos. Los sitios alternativos ms comunes son los siguientes:  El Product manager.  El muslo.  La zona posterior de la parte inferior de la pierna.  La palma de la mano. El flujo de [place] en estas zonas  es ms lento. Por lo tanto, los valores de glucosa en la sangre que obtenga pueden estar demorados, y los nmeros son diferentes de los que obtiene de los dedos. No saque sangre de sitios alternativos si cree que tiene hipoglucemia. Los valores no sern precisos. Siempre extraiga del dedo si tiene hipoglucemia. Adems, si no puede darse cuenta cuando tiene bajos los niveles (hipoglucemia asintomtica), siempre extraiga sangre de los dedos para los controles de glucosa en la [place]. CONSEJOS ADICIONALES PARA EL CONTROL DE LA GLUCOSA  No vuelva a utilizar las lancetas.  Siempre tenga los insumos a mano.  Todos los medidores de glucosa incluyen un nmero de telfono "directo", disponible las 24 horas, al que podr llamar si tiene preguntas o [place].  Ajuste (calibre) el medidor de glucosa con una solucin de control despus de terminar algunas cajas de tiras reactivas. LLEVE REGISTROS DE LOS NIVELES DE GLUCOSA EN LA SANGRE Es recomendable llevar un diario o un registro de los valores de glucosa en la [place]. La [employer] de los medidores de glucosa, sino todos, conservan el registro de la glucosa en el dispositivo. Algunos medidores permiten descargar los registros a su computadora. Llevar un registro de los valores  de glucosa en la sangre es especialmente til si desea observar los patrones. Haga anotaciones simultneas con la Microbiologist de los valores de glucosa en la sangre debido a que podra olvidar lo que ocurri en el momento exacto. Llevar un buen registro los ayudar a usted y al mdico a Printmaker juntos para Personnel officer un buen control de la diabetes.    Esta informacin no tiene Theme park manager el consejo del mdico. Asegrese de hacerle al mdico cualquier pregunta que tenga.   Document Released: 01/20/2005 Document Revised: 02/10/2014 Elsevier Interactive Patient Education [employer]

## 2015-03-08 NOTE — Patient Instructions (Signed)
La diabetes mellitus y los alimentos (Diabetes Mellitus and Food) Es importante que controle su nivel de azcar en la sangre (glucosa). El nivel de glucosa en sangre depende en gran medida de lo que usted come. Comer alimentos saludables en las cantidades adecuadas a lo largo del da, aproximadamente a la misma hora todos los das, lo ayudar a controlar su nivel de glucosa en sangre. Tambin puede ayudarlo a retrasar o evitar el empeoramiento de la diabetes mellitus. Comer de manera saludable incluso puede ayudarlo a mejorar el nivel de presin arterial y a alcanzar o mantener un peso saludable.  Entre las recomendaciones generales para alimentarse y cocinar los alimentos de forma saludable, se incluyen las siguientes:  Respetar las comidas principales y comer colaciones con regularidad. Evitar pasar largos perodos sin comer con el fin de perder peso.  Seguir una dieta que consista principalmente en alimentos de origen vegetal, como frutas, vegetales, frutos secos, legumbres y cereales integrales.  Utilizar mtodos de coccin a baja temperatura, como hornear, en lugar de mtodos de coccin a alta temperatura, como frer en abundante aceite. Trabaje con el nutricionista para aprender a usar la informacin nutricional de las etiquetas de los alimentos. CMO PUEDEN AFECTARME LOS ALIMENTOS? Carbohidratos Los carbohidratos afectan el nivel de glucosa en sangre ms que cualquier otro tipo de alimento. El nutricionista lo ayudar a determinar cuntos carbohidratos puede consumir en cada comida y ensearle a contarlos. El recuento de carbohidratos es importante para mantener la glucosa en sangre en un nivel saludable, en especial si utiliza insulina o toma determinados medicamentos para la diabetes mellitus. Alcohol El alcohol puede provocar disminuciones sbitas de la glucosa en sangre (hipoglucemia), en especial si utiliza insulina o toma determinados medicamentos para la diabetes mellitus. La  hipoglucemia es una afeccin que puede poner en peligro la vida. Los sntomas de la hipoglucemia (somnolencia, mareos y desorientacin) son similares a los sntomas de haber consumido mucho alcohol.  Si el mdico lo autoriza a beber alcohol, hgalo con moderacin y siga estas pautas:  Las mujeres no deben beber ms de un trago por da, y los hombres no deben beber ms de dos tragos por da. Un trago es igual a:  12 onzas (355 ml) de cerveza  5 onzas de vino (150 ml) de vino  1,5onzas (45ml) de bebidas espirituosas  No beba con el estmago vaco.  Mantngase hidratado. Beba agua, gaseosas dietticas o t helado sin azcar.  Las gaseosas comunes, los jugos y otros refrescos podran contener muchos carbohidratos y se deben contar. QU ALIMENTOS NO SE RECOMIENDAN? Cuando haga las elecciones de alimentos, es importante que recuerde que todos los alimentos son distintos. Algunos tienen menos nutrientes que otros por porcin, aunque podran tener la misma cantidad de caloras o carbohidratos. Es difcil darle al cuerpo lo que necesita cuando consume alimentos con menos nutrientes. Estos son algunos ejemplos de alimentos que debera evitar ya que contienen muchas caloras y carbohidratos, pero pocos nutrientes:  Grasas trans (la mayora de los alimentos procesados incluyen grasas trans en la etiqueta de Informacin nutricional).  Gaseosas comunes.  Jugos.  Caramelos.  Dulces, como tortas, pasteles, rosquillas y galletas.  Comidas fritas. QU ALIMENTOS PUEDO COMER? Consuma alimentos ricos en nutrientes, que nutrirn el cuerpo y lo mantendrn saludable. Los alimentos que debe comer tambin dependern de varios factores, como:  Las caloras que necesita.  Los medicamentos que toma.  Su peso.  El nivel de glucosa en sangre.  El nivel de presin arterial.  El nivel de colesterol.   Debe consumir una amplia variedad de alimentos, por ejemplo:  Protenas.  Cortes de carne  magros.  Protenas con bajo contenido de grasas saturadas, como pescado, clara de huevo y frijoles. Evite las carnes procesadas.  Frutas y vegetales.  Frutas y vegetales que pueden ayudar a controlar los niveles sanguneos de glucosa, como manzanas, mangos y batatas.  Productos lcteos.  Elija productos lcteos sin grasa o con bajo contenido de grasa, como leche, yogur y queso.  Cereales, panes, pastas y arroz.  Elija cereales integrales, como panes multicereales, avena en grano y arroz integral. Estos alimentos pueden ayudar a controlar la presin arterial.  Grasas.  Alimentos que contengan grasas saludables, como frutos secos, aguacate, aceite de oliva, aceite de canola y pescado. TODOS LOS QUE PADECEN DIABETES MELLITUS TIENEN EL MISMO PLAN DE COMIDAS? Dado que todas las personas que padecen diabetes mellitus son distintas, no hay un solo plan de comidas que funcione para todos. Es muy importante que se rena con un nutricionista que lo ayudar a crear un plan de comidas adecuado para usted.   Esta informacin no tiene como fin reemplazar el consejo del mdico. Asegrese de hacerle al mdico cualquier pregunta que tenga.   Document Released: 04/29/2007 Document Revised: 02/10/2014 Elsevier Interactive Patient Education 2016 [employer]  

## 2015-03-08 NOTE — Progress Notes (Signed)
Patient here for hospital follow up for chest pain and shortness of breath. Patient denies pain at this time.

## 2015-03-08 NOTE — Progress Notes (Signed)
S:    Patient arrives in good spirits with daughter.  Presents for diabetes evaluation, education, and management at the request of Dr. Venetia Night. Patient was referred on 03/08/15.  Patient was last seen by Primary Care Provider on 03/08/15.   Patient reports diabetes was diagnosed in January 2017.   Patient denies adherence with medications.  Current diabetes medications include: metformin  500 mg BID but she has not started taking it yet.  Patient reported dietary habits: patient eats beans, green beans, tortillas, meat.   Patient reported exercise habits: none   Patient denies nocturia.  Patient denies neuropathy. Patient denies visual changes. Patient reports self foot exams.    O:  Lab Results  Component Value Date   HGBA1C 7.2* 03/06/2015    A/P: Diabetes newly diagnosed currently uncontrolled based on A1c of 7.2. Patient denies hypoglycemic events and is able to verbalize appropriate hypoglycemia management plan. Patient denies adherence with medication. Control is suboptimal due to dietary indiscretion.  Reviewed what diabetes is with the patient and the consequences of uncontrolled blood glucose (stroke, heart disease, kidney disease). Explained goal blood glucose levels. Reviewed basic carbohydrate counseling. Also educated patient on blood glucose meter use and patient was able to demonstrate use. Patient agrees to use blood glucose meter to check her blood glucose at home and will start the metformin tonight. Instructed patient to take metformin with food to avoid stomach upset. Patient verbalized understanding  Written patient instructions provided on hypoglycemia, basic carb counting, and blood glucose monitoring.  Total time in face to face counseling 35 minutes.  Follow up with Dr. Venetia Night.

## 2015-03-23 ENCOUNTER — Ambulatory Visit: Payer: Self-pay | Attending: Family Medicine | Admitting: Family Medicine

## 2015-03-23 ENCOUNTER — Encounter: Payer: Self-pay | Admitting: Family Medicine

## 2015-03-23 VITALS — BP 123/75 | HR 76 | Temp 97.6°F | Resp 15 | Ht <= 58 in | Wt 103.0 lb

## 2015-03-23 DIAGNOSIS — Z79899 Other long term (current) drug therapy: Secondary | ICD-10-CM | POA: Insufficient documentation

## 2015-03-23 DIAGNOSIS — E119 Type 2 diabetes mellitus without complications: Secondary | ICD-10-CM | POA: Insufficient documentation

## 2015-03-23 DIAGNOSIS — K219 Gastro-esophageal reflux disease without esophagitis: Secondary | ICD-10-CM | POA: Insufficient documentation

## 2015-03-23 DIAGNOSIS — Z7984 Long term (current) use of oral hypoglycemic drugs: Secondary | ICD-10-CM | POA: Insufficient documentation

## 2015-03-23 LAB — GLUCOSE, POCT (MANUAL RESULT ENTRY): POC Glucose: 172 mg/dl — AB (ref 70–99)

## 2015-03-23 MED ORDER — METFORMIN HCL 500 MG PO TABS: 500.0000 mg | ORAL_TABLET | Freq: Two times a day (BID) | ORAL | Status: AC

## 2015-03-23 MED ORDER — RANITIDINE HCL 150 MG PO TABS: 150.0000 mg | ORAL_TABLET | Freq: Two times a day (BID) | ORAL | Status: AC

## 2015-03-23 MED FILL — raNITIdine HCL 150 MG TABS: 150 | 30 days supply | Qty: 60 | Fill #0

## 2015-03-23 NOTE — Progress Notes (Signed)
Patient reports no issues She does request refills Ate 1 hour ago

## 2015-03-23 NOTE — Progress Notes (Signed)
Subjective:  Patient ID: Nicole Booker, female    DOB: [birth year]  Age: 70 y.o. MRN: [id number]  CC: Follow-up   HPI Nicole Booker is a 69 year old female with a history of GERD, newly diagnosed type 2 diabetes mellitus (A1c 7.2) who was recently commenced on metformin at her last office visit.  Since her last office visit she was seen by the clinical pharmacist for diabetic teaching and she reports doing well. She will be relocating to [place] next month and is requesting a refill for her medications.  She is accompanied by her daughter today and is seen with the aid of Video interpreter; she has no complaints today.  Outpatient Prescriptions Prior to Visit  Medication Sig Dispense Refill  . Blood Glucose Monitoring Suppl (TRUE METRIX METER) DEVI 1 each by Does not apply route daily before breakfast. 1 Device 0  . glucose blood (TRUE METRIX BLOOD GLUCOSE TEST) test strip Use daily before meals 30 each 3  . TRUEPLUS LANCETS 28G MISC 1 each by Does not apply route daily before breakfast. 100 each 12  . metFORMIN (GLUCOPHAGE) 500 MG tablet Take 1 tablet (500 mg total) by mouth 2 (two) times daily with a meal. 60 tablet 0  . ranitidine (ZANTAC) 150 MG tablet Take 1 tablet (150 mg total) by mouth 2 (two) times daily. 30 tablet 0   No facility-administered medications prior to visit.    ROS Review of Systems  Constitutional: Negative for activity change and appetite change.  HENT: Negative for sinus pressure and sore throat.   Respiratory: Negative for chest tightness, shortness of breath and wheezing.   Cardiovascular: Negative for chest pain and palpitations.  Gastrointestinal: Negative for abdominal pain, constipation and abdominal distention.  Genitourinary: Negative.   Musculoskeletal: Negative.   Psychiatric/Behavioral: Negative for behavioral problems and dysphoric mood.    Objective:  BP 123/75 mmHg  Pulse 76  Temp(Src) 97.6 F (36.4 C)  Resp 15  Ht   (1.473 m)  Wt 103 lb (46.72 kg)  BMI 21.53 kg/m2  SpO2 98%  BP/Weight 03/23/2015 03/08/2015 03/07/2015  Systolic BP 123 111 90  Diastolic BP 75 70 58  Wt. (Lbs) 103 103 -  BMI 21.53 21.53 -      Physical Exam  Constitutional: She is oriented to person, place, and time. She appears well-developed and well-nourished.  Cardiovascular: Normal rate, normal heart sounds and intact distal pulses.   No murmur heard. Pulmonary/Chest: Effort normal and breath sounds normal. She has no wheezes. She has no rales. She exhibits no tenderness.  Abdominal: Soft. Bowel sounds are normal. She exhibits no distension and no mass. There is no tenderness.  Musculoskeletal: Normal range of motion.  Neurological: She is alert and oriented to person, place, and time.     Assessment & Plan:   1. Type 2 diabetes mellitus without complication, without long-term current use of insulin (HCC) Controlled with A1c of 7.2 Continue lifestyle modifications-diabetic diet, exercise as tolerated - Glucose (CBG) - metFORMIN (GLUCOPHAGE) 500 MG tablet; Take 1 tablet (500 mg total) by mouth 2 (two) times daily with a meal.  Dispense: 180 tablet; Refill: 0  2. Gastroesophageal reflux disease without esophagitis Controlled - ranitidine (ZANTAC) 150 MG tablet; Take 1 tablet (150 mg total) by mouth 2 (two) times daily.  Dispense: 180 tablet; Refill: 0   Meds ordered this encounter  Medications  . ranitidine (ZANTAC) 150 MG tablet    Sig: Take 1 tablet (150 mg total) by mouth  2 (two) times daily.    Dispense:  180 tablet    Refill:  0  . metFORMIN (GLUCOPHAGE) 500 MG tablet    Sig: Take 1 tablet (500 mg total) by mouth 2 (two) times daily with a meal.    Dispense:  180 tablet    Refill:  0    Follow-up: Return in about 3 months (around 06/20/2015) for Follow-up of diabetes mellitus, she needs to establish with a PCP when she gets to [place].   Jaclyn Shaggy MD

## 2015-03-23 NOTE — Patient Instructions (Signed)
La diabetes mellitus y los alimentos (Diabetes Mellitus and Food) Es importante que controle su nivel de azcar en la sangre (glucosa). El nivel de glucosa en sangre depende en gran medida de lo que usted come. Comer alimentos saludables en las cantidades adecuadas a lo largo del da, aproximadamente a la misma hora todos los das, lo ayudar a controlar su nivel de glucosa en sangre. Tambin puede ayudarlo a retrasar o evitar el empeoramiento de la diabetes mellitus. Comer de manera saludable incluso puede ayudarlo a mejorar el nivel de presin arterial y a alcanzar o mantener un peso saludable.  Entre las recomendaciones generales para alimentarse y cocinar los alimentos de forma saludable, se incluyen las siguientes:  Respetar las comidas principales y comer colaciones con regularidad. Evitar pasar largos perodos sin comer con el fin de perder peso.  Seguir una dieta que consista principalmente en alimentos de origen vegetal, como frutas, vegetales, frutos secos, legumbres y cereales integrales.  Utilizar mtodos de coccin a baja temperatura, como hornear, en lugar de mtodos de coccin a alta temperatura, como frer en abundante aceite. Trabaje con el nutricionista para aprender a usar la informacin nutricional de las etiquetas de los alimentos. CMO PUEDEN AFECTARME LOS ALIMENTOS? Carbohidratos Los carbohidratos afectan el nivel de glucosa en sangre ms que cualquier otro tipo de alimento. El nutricionista lo ayudar a determinar cuntos carbohidratos puede consumir en cada comida y ensearle a contarlos. El recuento de carbohidratos es importante para mantener la glucosa en sangre en un nivel saludable, en especial si utiliza insulina o toma determinados medicamentos para la diabetes mellitus. Alcohol El alcohol puede provocar disminuciones sbitas de la glucosa en sangre (hipoglucemia), en especial si utiliza insulina o toma determinados medicamentos para la diabetes mellitus. La  hipoglucemia es una afeccin que puede poner en peligro la vida. Los sntomas de la hipoglucemia (somnolencia, mareos y desorientacin) son similares a los sntomas de haber consumido mucho alcohol.  Si el mdico lo autoriza a beber alcohol, hgalo con moderacin y siga estas pautas:  Las mujeres no deben beber ms de un trago por da, y los hombres no deben beber ms de dos tragos por da. Un trago es igual a:  12 onzas (355 ml) de cerveza  5 onzas de vino (150 ml) de vino  1,5onzas (45ml) de bebidas espirituosas  No beba con el estmago vaco.  Mantngase hidratado. Beba agua, gaseosas dietticas o t helado sin azcar.  Las gaseosas comunes, los jugos y otros refrescos podran contener muchos carbohidratos y se deben contar. QU ALIMENTOS NO SE RECOMIENDAN? Cuando haga las elecciones de alimentos, es importante que recuerde que todos los alimentos son distintos. Algunos tienen menos nutrientes que otros por porcin, aunque podran tener la misma cantidad de caloras o carbohidratos. Es difcil darle al cuerpo lo que necesita cuando consume alimentos con menos nutrientes. Estos son algunos ejemplos de alimentos que debera evitar ya que contienen muchas caloras y carbohidratos, pero pocos nutrientes:  Grasas trans (la mayora de los alimentos procesados incluyen grasas trans en la etiqueta de Informacin nutricional).  Gaseosas comunes.  Jugos.  Caramelos.  Dulces, como tortas, pasteles, rosquillas y galletas.  Comidas fritas. QU ALIMENTOS PUEDO COMER? Consuma alimentos ricos en nutrientes, que nutrirn el cuerpo y lo mantendrn saludable. Los alimentos que debe comer tambin dependern de varios factores, como:  Las caloras que necesita.  Los medicamentos que toma.  Su peso.  El nivel de glucosa en sangre.  El nivel de presin arterial.  El nivel de colesterol.   Debe consumir una amplia variedad de alimentos, por ejemplo:  Protenas.  Cortes de carne  magros.  Protenas con bajo contenido de grasas saturadas, como pescado, clara de huevo y frijoles. Evite las carnes procesadas.  Frutas y vegetales.  Frutas y vegetales que pueden ayudar a controlar los niveles sanguneos de glucosa, como manzanas, mangos y batatas.  Productos lcteos.  Elija productos lcteos sin grasa o con bajo contenido de grasa, como leche, yogur y queso.  Cereales, panes, pastas y arroz.  Elija cereales integrales, como panes multicereales, avena en grano y arroz integral. Estos alimentos pueden ayudar a controlar la presin arterial.  Grasas.  Alimentos que contengan grasas saludables, como frutos secos, aguacate, aceite de oliva, aceite de canola y pescado. TODOS LOS QUE PADECEN DIABETES MELLITUS TIENEN EL MISMO PLAN DE COMIDAS? Dado que todas las personas que padecen diabetes mellitus son distintas, no hay un solo plan de comidas que funcione para todos. Es muy importante que se rena con un nutricionista que lo ayudar a crear un plan de comidas adecuado para usted.   Esta informacin no tiene como fin reemplazar el consejo del mdico. Asegrese de hacerle al mdico cualquier pregunta que tenga.   Document Released: 04/29/2007 Document Revised: 02/10/2014 Elsevier Interactive Patient Education 2016 [employer]  

## 2016-09-26 IMAGING — DX DG CHEST 2V
2 series · 2 of 2 positions shown · non-contrast
Comparison: None.

CLINICAL DATA: Chest pain tonight

EXAM:
CHEST  2 VIEW

[chest pa]
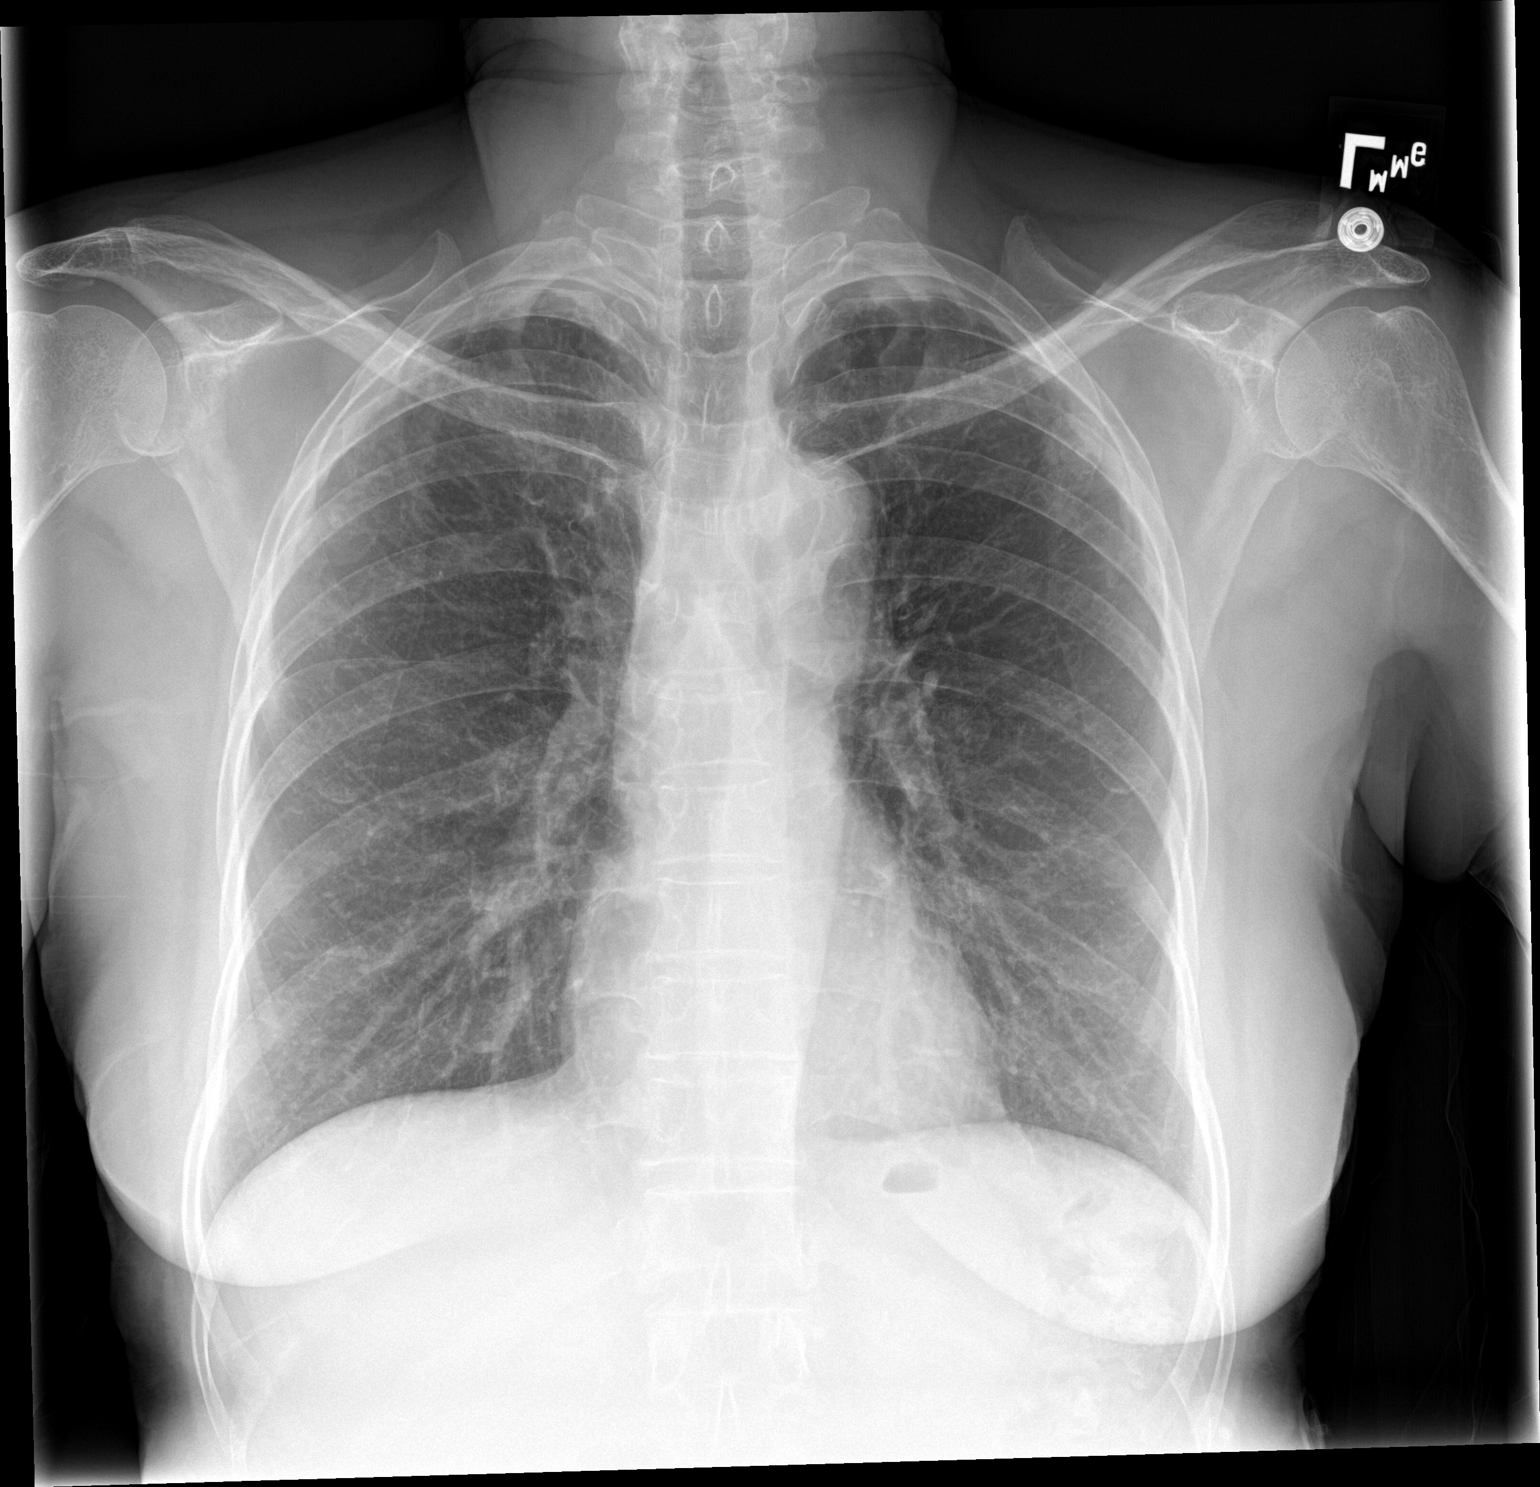

[chest lat]
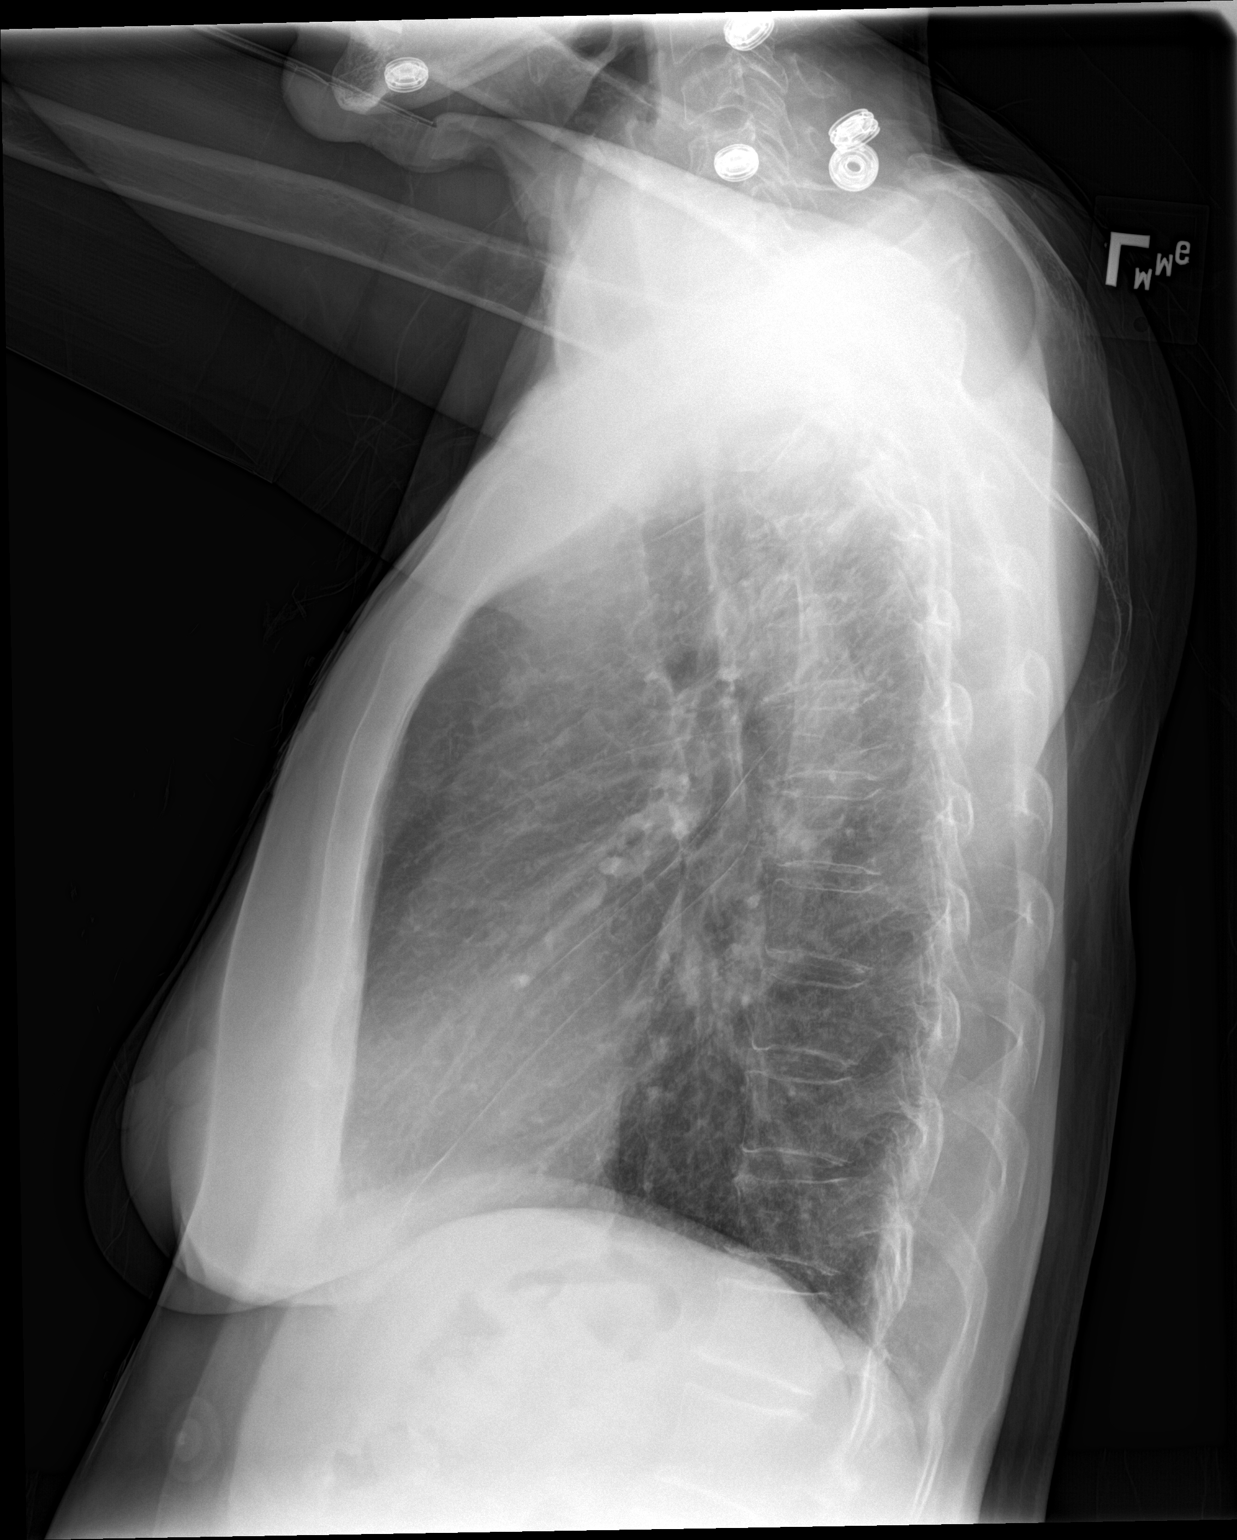

[2 of 2 positions shown; findings below may reference images not displayed]

FINDINGS: EKG leads create nodular opacities over the bilateral chest.

Normal heart size and mediastinal contours. No acute infiltrate or
edema. Symmetric biapical pleural thickening/ scarring. No effusion
or pneumothorax. No acute osseous findings.
IMPRESSION: No active cardiopulmonary disease.
